# Patient Record
Sex: Male | Born: 1995 | Race: Black or African American | Hispanic: No | Marital: Single | Smoking: Never smoker
Health system: Southern US, Community
[De-identification: ages and names within clinical notes are randomized; demographics above are authoritative.]

## PROBLEM LIST (undated history)

## (undated) DIAGNOSIS — F909 Attention-deficit hyperactivity disorder, unspecified type: Secondary | ICD-10-CM

## (undated) HISTORY — DX: Attention-deficit hyperactivity disorder, unspecified type: F90.9

---
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
## 2017-07-12 IMAGING — CR DG CHEST 2V
2 series · 2 of 2 positions shown · non-contrast
Comparison: None.

CLINICAL DATA: Pt states mid chest "numbness" x 2-3 days. No other
c/c

EXAM:
CHEST  2 VIEW

[w chest pa]
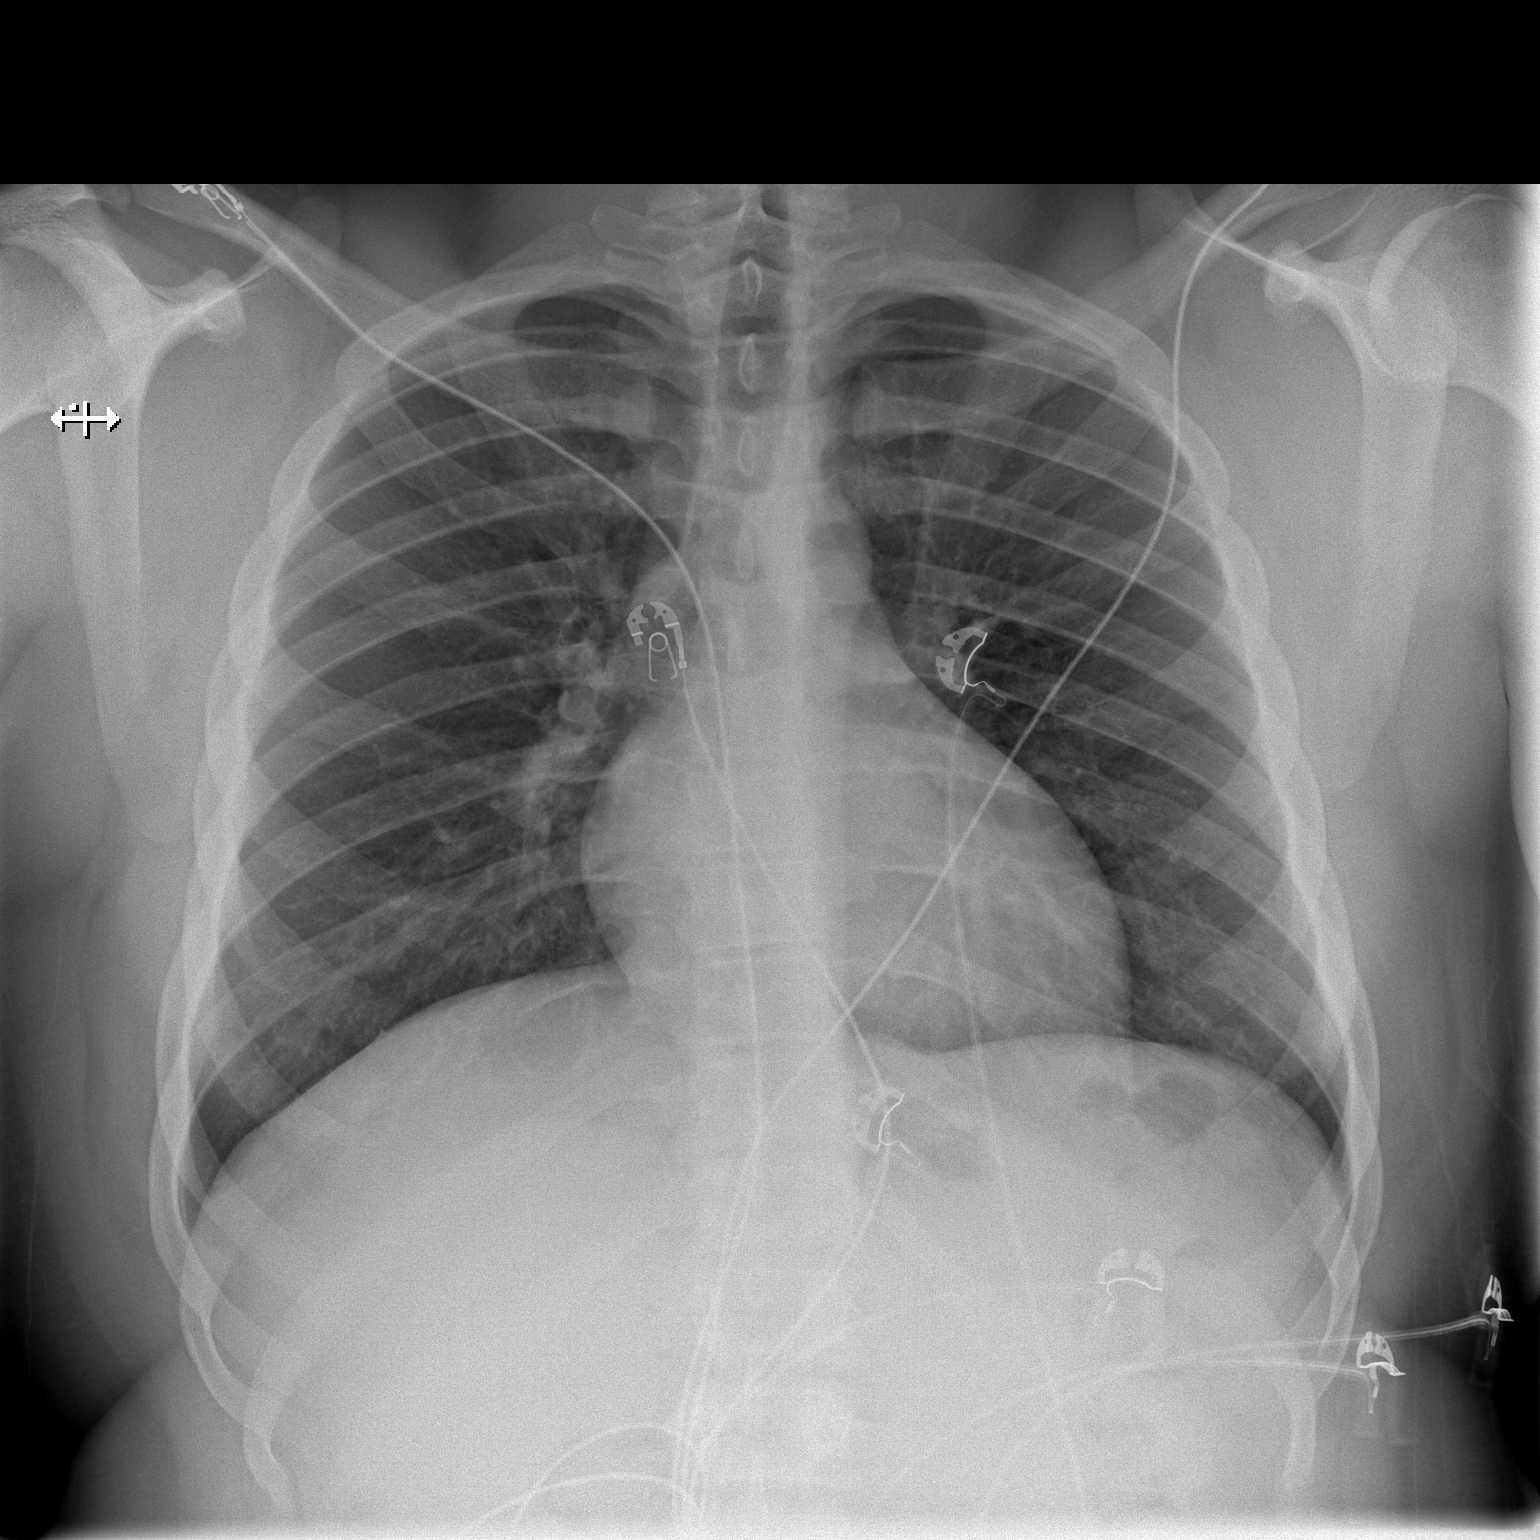

[w chest lat]
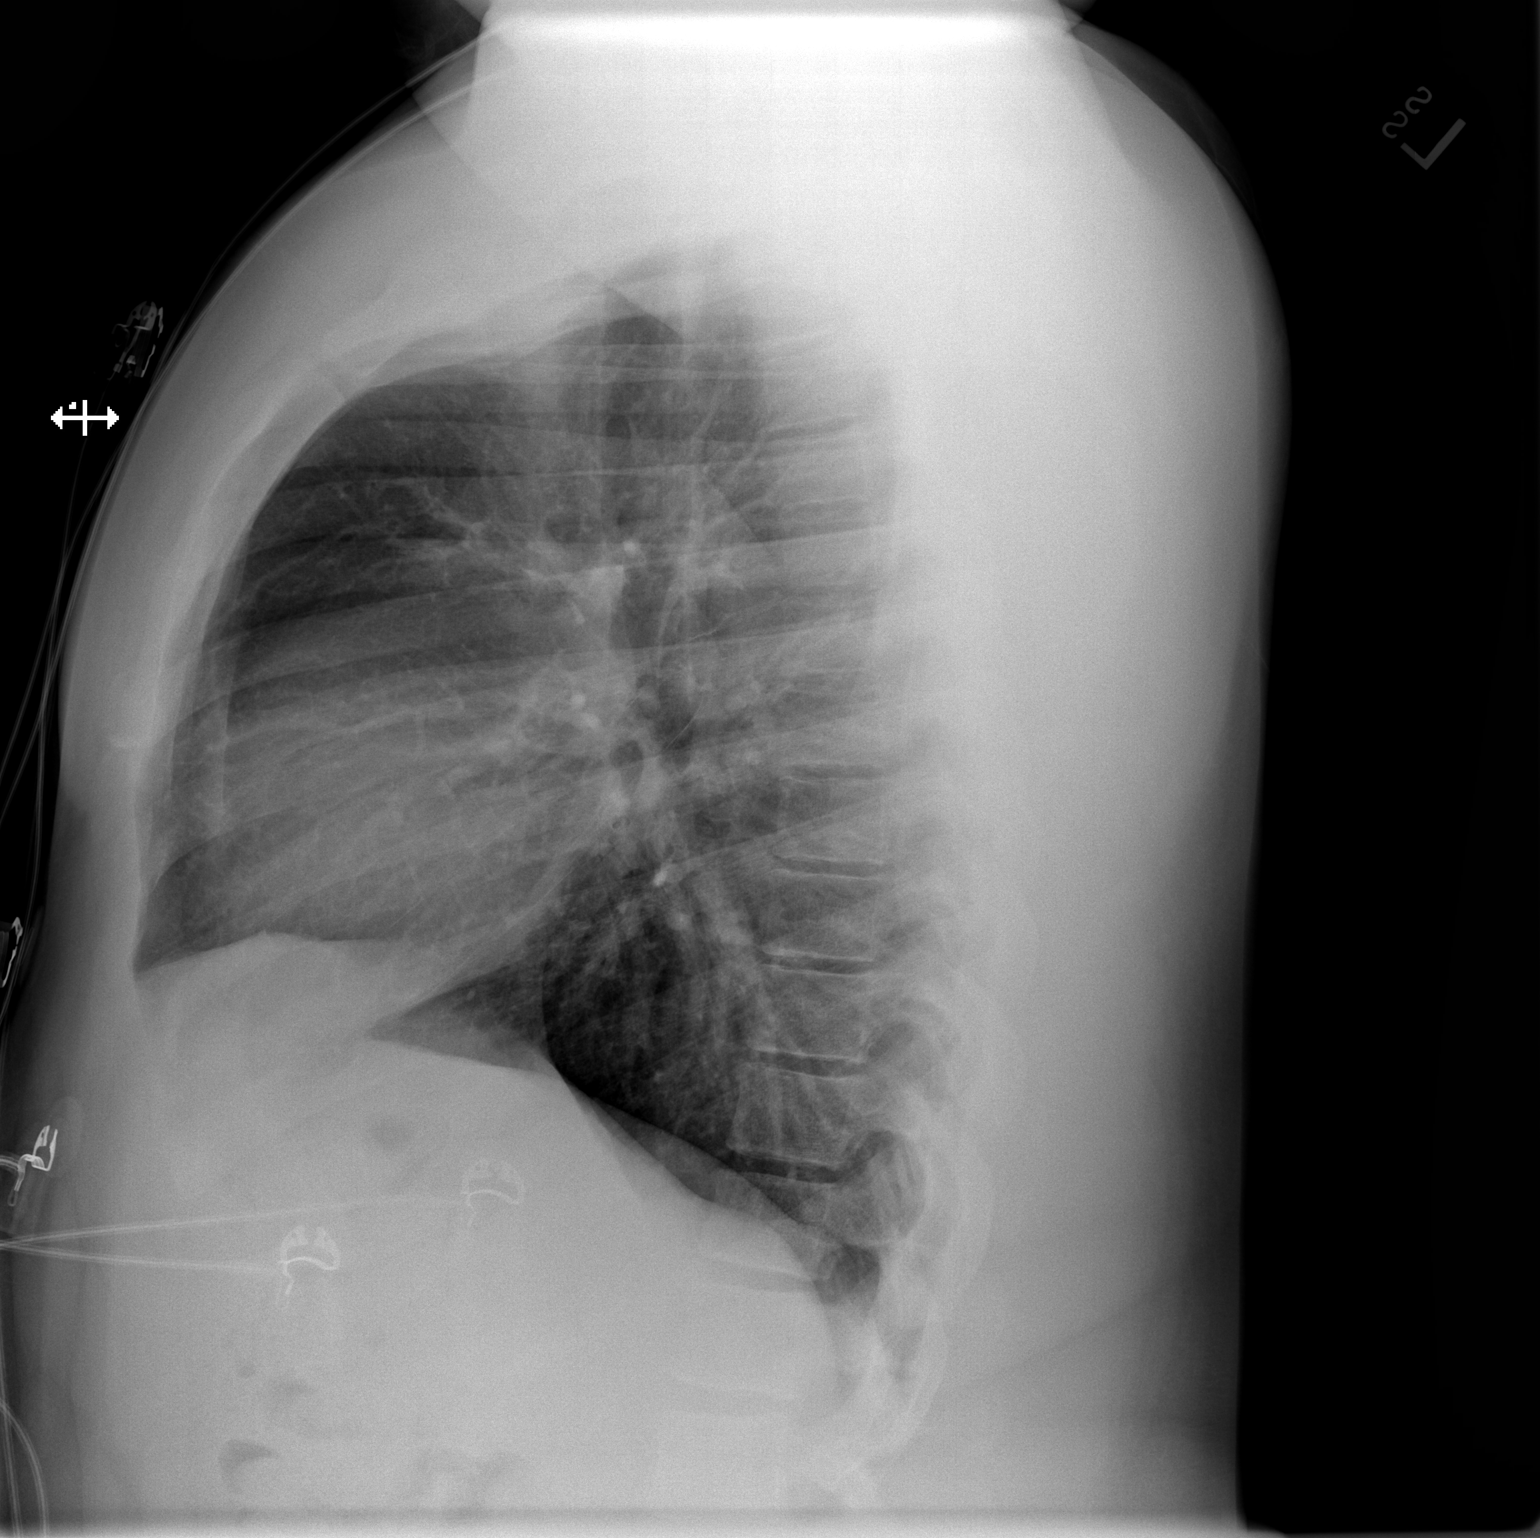

[2 of 2 positions shown; findings below may reference images not displayed]

FINDINGS: Midline trachea.  Normal heart size and mediastinal contours.

Sharp costophrenic angles.  No pneumothorax.  Clear lungs.
IMPRESSION: No active cardiopulmonary disease.
